# Patient Record
Sex: Female | Born: 1970 | Race: Black or African American | Hispanic: No | Smoking: Current every day smoker
Health system: Southern US, Community
[De-identification: ages and names within clinical notes are randomized; demographics above are authoritative.]

## PROBLEM LIST (undated history)

## (undated) DIAGNOSIS — N809 Endometriosis, unspecified: Secondary | ICD-10-CM

## (undated) DIAGNOSIS — I10 Essential (primary) hypertension: Secondary | ICD-10-CM

## (undated) HISTORY — PX: OTHER SURGICAL HISTORY: SHX169

## (undated) HISTORY — PX: BACK SURGERY: SHX140

---

## 2004-01-24 HISTORY — PX: BREAST EXCISIONAL BIOPSY: SUR124

## 2004-11-14 ENCOUNTER — Other Ambulatory Visit: Payer: Self-pay

## 2004-11-14 ENCOUNTER — Emergency Department: Payer: Self-pay | Admitting: Emergency Medicine

## 2005-02-27 ENCOUNTER — Emergency Department: Payer: Self-pay | Admitting: Emergency Medicine

## 2005-07-25 ENCOUNTER — Other Ambulatory Visit: Payer: Self-pay

## 2005-07-25 ENCOUNTER — Emergency Department: Payer: Self-pay | Admitting: Emergency Medicine

## 2008-03-02 ENCOUNTER — Ambulatory Visit: Payer: Self-pay | Admitting: Internal Medicine

## 2008-03-05 ENCOUNTER — Ambulatory Visit: Payer: Self-pay | Admitting: Internal Medicine

## 2008-05-17 ENCOUNTER — Emergency Department: Payer: Self-pay | Admitting: Emergency Medicine

## 2008-12-01 ENCOUNTER — Ambulatory Visit: Payer: Self-pay | Admitting: Unknown Physician Specialty

## 2009-09-04 ENCOUNTER — Emergency Department: Payer: Self-pay | Admitting: Emergency Medicine

## 2010-12-13 ENCOUNTER — Emergency Department: Payer: Self-pay | Admitting: *Deleted

## 2015-04-18 ENCOUNTER — Encounter: Payer: Self-pay | Admitting: Emergency Medicine

## 2015-04-18 ENCOUNTER — Emergency Department
Admission: EM | Admit: 2015-04-18 | Discharge: 2015-04-18 | Disposition: A | Discharge status: Home / Self Care | Payer: BLUE CROSS/BLUE SHIELD | Attending: Emergency Medicine | Admitting: Emergency Medicine

## 2015-04-18 DIAGNOSIS — F17210 Nicotine dependence, cigarettes, uncomplicated: Secondary | ICD-10-CM | POA: Insufficient documentation

## 2015-04-18 DIAGNOSIS — F419 Anxiety disorder, unspecified: Secondary | ICD-10-CM | POA: Diagnosis not present

## 2015-04-18 DIAGNOSIS — Z3202 Encounter for pregnancy test, result negative: Secondary | ICD-10-CM | POA: Diagnosis not present

## 2015-04-18 DIAGNOSIS — R110 Nausea: Secondary | ICD-10-CM

## 2015-04-18 DIAGNOSIS — R1013 Epigastric pain: Secondary | ICD-10-CM | POA: Insufficient documentation

## 2015-04-18 DIAGNOSIS — R002 Palpitations: Secondary | ICD-10-CM | POA: Diagnosis not present

## 2015-04-18 DIAGNOSIS — I10 Essential (primary) hypertension: Secondary | ICD-10-CM | POA: Insufficient documentation

## 2015-04-18 HISTORY — DX: Endometriosis, unspecified: N80.9

## 2015-04-18 HISTORY — DX: Essential (primary) hypertension: I10

## 2015-04-18 LAB — COMPREHENSIVE METABOLIC PANEL
ALK PHOS: 67 U/L (ref 38–126)
ALT: 28 U/L (ref 14–54)
AST: 37 U/L (ref 15–41)
Albumin: 4.1 g/dL (ref 3.5–5.0)
Anion gap: 8 (ref 5–15)
BILIRUBIN TOTAL: 0.6 mg/dL (ref 0.3–1.2)
BUN: 9 mg/dL (ref 6–20)
CO2: 20 mmol/L — ABNORMAL LOW (ref 22–32)
CREATININE: 1.13 mg/dL — AB (ref 0.44–1.00)
Calcium: 9.5 mg/dL (ref 8.9–10.3)
Chloride: 102 mmol/L (ref 101–111)
GFR calc Af Amer: >60 mL/min (ref >60)
GFR, EST NON AFRICAN AMERICAN: 58 mL/min — AB (ref >60)
GLUCOSE: 102 mg/dL — AB (ref 65–99)
Potassium: 3.1 mmol/L — ABNORMAL LOW (ref 3.5–5.1)
Sodium: 130 mmol/L — ABNORMAL LOW (ref 135–145)
TOTAL PROTEIN: 8.2 g/dL — AB (ref 6.5–8.1)

## 2015-04-18 LAB — TROPONIN I

## 2015-04-18 LAB — CBC
HEMATOCRIT: 36.5 % (ref 35.0–47.0)
Hemoglobin: 11.9 g/dL — ABNORMAL LOW (ref 12.0–16.0)
MCH: 29.1 pg (ref 26.0–34.0)
MCHC: 32.6 g/dL (ref 32.0–36.0)
MCV: 89.2 fL (ref 80.0–100.0)
PLATELETS: 304 10*3/uL (ref 150–440)
RBC: 4.09 MIL/uL (ref 3.80–5.20)
RDW: 13.9 % (ref 11.5–14.5)
WBC: 11.7 10*3/uL — AB (ref 3.6–11.0)

## 2015-04-18 LAB — URINALYSIS COMPLETE WITH MICROSCOPIC (ARMC ONLY)
BILIRUBIN URINE: NEGATIVE
GLUCOSE, UA: NEGATIVE mg/dL
KETONES UR: NEGATIVE mg/dL
LEUKOCYTES UA: NEGATIVE
NITRITE: NEGATIVE
PH: 6 (ref 5.0–8.0)
Protein, ur: NEGATIVE mg/dL
Specific Gravity, Urine: 1.008 (ref 1.005–1.030)

## 2015-04-18 LAB — POCT PREGNANCY, URINE: Preg Test, Ur: NEGATIVE

## 2015-04-18 LAB — LIPASE, BLOOD: LIPASE: 19 U/L (ref 11–51)

## 2015-04-18 MED ORDER — ONDANSETRON 4 MG PO TBDP: 4.0000 mg | ORAL_TABLET | Freq: Once | ORAL | Status: DC | PRN

## 2015-04-18 MED ORDER — SODIUM CHLORIDE 0.9 % IV BOLUS (SEPSIS)
1000.0000 mL | Freq: Once | INTRAVENOUS | Status: AC
  Administered 2015-04-18: 1000 mL via INTRAVENOUS

## 2015-04-18 MED ORDER — ONDANSETRON 4 MG PO TBDP: 4.0000 mg | ORAL_TABLET | Freq: Three times a day (TID) | ORAL | Status: AC | PRN

## 2015-04-18 MED ORDER — LORAZEPAM 2 MG/ML IJ SOLN
1.0000 mg | Freq: Once | INTRAMUSCULAR | Status: AC
  Administered 2015-04-18: 1 mg via INTRAVENOUS
  Filled 2015-04-18: qty 1

## 2015-04-18 MED ORDER — ONDANSETRON HCL 4 MG/2ML IJ SOLN
4.0000 mg | Freq: Once | INTRAMUSCULAR | Status: AC
  Administered 2015-04-18: 4 mg via INTRAVENOUS
  Filled 2015-04-18: qty 2

## 2015-04-18 MED ORDER — DIAZEPAM 5 MG PO TABS: 5.0000 mg | ORAL_TABLET | Freq: Three times a day (TID) | ORAL | Status: AC | PRN

## 2015-04-18 NOTE — ED Notes (Signed)
Pt ambulatory to restroom , with a steady gait

## 2015-04-18 NOTE — ED Provider Notes (Signed)
[facility] Emergency Department Provider Note  Time seen: 2:37 PM  I have reviewed the triage vital signs and the nursing notes.   HISTORY  Chief Complaint Anxiety and Palpitations    HPI Megan Pittman is a 45 y.o. female with a past medical history of hypertension presents the emergency department with anxiety, palpitations, agitation. According to the patient she works overnight's, Friday she did not sleep at all during the day and had to work overnight Friday night so she took approximately 8 energy tablets over the course of the evening. Denies any intent to hurt herself. Patient states she kept taking them because she continued to feel very tired like she was going to follow sleep. She states she was able to work her shift however upon going home she is not able to sleep, very fidgety, feeling like her heart is racing, develop nausea had several episodes of vomiting. She states she was able to sleep abruptly one or 2 hours yesterday, but continues to feel anxious with palpitations today so she came to the emergency department. Denies any chest pain she does state epigastric pain but only when she feels nauseated like she has to vomit. Denies diarrhea. Denies dysuria. Denies fever.     Past Medical History  Diagnosis Date  . Hypertension   . Endometriosis     There are no active problems to display for this patient.   Past Surgical History  Procedure Laterality Date  . Back surgery    . Mass removed      No current outpatient prescriptions on file.  Allergies Review of patient's allergies indicates no known allergies.  No family history on file.  Social History Social History  Substance Use Topics  . Smoking status: Current Every Day Smoker -- 0.50 packs/day    Types: Cigarettes  . Smokeless tobacco: None  . Alcohol Use: Yes     Comment: socially    Review of Systems Constitutional: Negative for fever. Cardiovascular: Negative for  chest pain. Positive for palpitations. Respiratory: Negative for shortness of breath. Gastrointestinal: Epigastric pain positive for nausea and vomiting. Negative diarrhea. Genitourinary: Negative for dysuria. Neurological: Negative for headache 10-point ROS otherwise negative.  ____________________________________________   PHYSICAL EXAM:  VITAL SIGNS: ED Triage Vitals  Enc Vitals Group     BP 04/18/15 1354 164/89 mmHg     Pulse Rate 04/18/15 1354 93     Resp 04/18/15 1354 20     Temp 04/18/15 1354 98.2 F (36.8 C)     Temp Source 04/18/15 1354 Oral     SpO2 04/18/15 1354 100 %     Weight 04/18/15 1354 204 lb (92.534 kg)     Height 04/18/15 1354 5' 10.5" (1.791 m)     Head Cir --      Peak Flow --      Pain Score 04/18/15 1355 9     Pain Loc --      Pain Edu? --      Excl. in GC? --     Constitutional: Alert and oriented. Mild distress due to anxiety, fidgety during exam Eyes: Normal exam ENT   Head: Normocephalic and atraumatic   Mouth/Throat: Mucous membranes are moist. Cardiovascular: Normal rate, regular rhythm. No murmur Respiratory: Normal respiratory effort without tachypnea nor retractions. Breath sounds are clear Gastrointestinal: Soft, mild epigastric tenderness to palpation without rebound or guarding. No distention. Musculoskeletal: Nontender with normal range of motion in all extremities.  Neurologic:  Normal speech and  language. No gross focal neurologic deficits Skin:  Skin is warm, dry and intact.  Psychiatric: Anxious, voices tremulous when speaking.  ____________________________________________    EKG  EKG reviewed and interpreted by myself shows normal sinus rhythm at 88 bpm, narrow QRS, normal axis, normal intervals, nonspecific but no concerning ST changes.   INITIAL IMPRESSION / ASSESSMENT AND PLAN / ED COURSE  Pertinent labs & imaging results that were available during my care of the patient were reviewed by me and considered in my  medical decision making (see chart for details).  Patient presents the emergency department with anxiety and palpitations after taking a significant amount of energy tablets approximately 36 hours ago. We will dose nausea medication, IV hydrate, does Ativan, while awaiting lab results. Overall the patient appears well, nontoxic but she does appear quite anxious.  Labs are largely within normal limits besides a lower sodium level. Patient states she has been trying to drink a lot of water to help with her symptoms. I discussed with the patient drinking Gatorade or other fluids with electrolytes. Overall the patient appears much better but still states she feels very anxious and jittery. We will discharge with a short course of Valium. I emphasized that the patient needs to take this medication as prescribed as to much of this medication can cause harm including respiratory arrest. Patient is agreeable to plan. We'll discharge home with primary care follow-up.  ____________________________________________   FINAL CLINICAL IMPRESSION(S) / ED DIAGNOSES  Palpitations Anxiety  Minna Antis, MD 04/18/15 [id number]

## 2015-04-18 NOTE — ED Notes (Signed)
Pt EKG performed by Loleta DickerAnna Holt (RN)

## 2015-04-18 NOTE — ED Notes (Signed)
Patient presents to the ED with nausea, anxiety, and agitations since Saturday morning with feeling like her heart is beating fast.  Patient reports taking 8 energy pills and drinking 2 energy drinks yesterday.  Patient is very fidgety during triage, cannot sit still.  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  Patient states she has not slept much since she took the medication.  Patient reports sleeping two hours yesterday.  Patient denies having any caffeine since that time.

## 2015-04-18 NOTE — Discharge Instructions (Signed)
Nausea, Adult Nausea means you feel sick to your stomach or need to throw up (vomit). It may be a sign of a more serious problem. If nausea gets worse, you may throw up. If you throw up a lot, you may lose too much body fluid (dehydration). HOME CARE   Get plenty of rest.  Ask your doctor how to replace body fluid losses (rehydrate).  Eat small amounts of food. Sip liquids more often.  Take all medicines as told by your doctor. GET HELP RIGHT AWAY IF:  You have a fever.  You pass out (faint).  You keep throwing up or have blood in your throw up.  You are very weak, have dry lips or a dry mouth, or you are very thirsty (dehydrated).  You have dark or bloody poop (stool).  You have very bad chest or belly (abdominal) pain.  You do not get better after 2 days, or you get worse.  You have a headache. MAKE SURE YOU:  Understand these instructions.  Will watch your condition.  Will get help right away if you are not doing well or get worse.   This information is not intended to replace advice given to you by your health care provider. Make sure you discuss any questions you have with your health care provider.   Document Released: 12/29/2010 Document Revised: 04/03/2011 Document Reviewed: 12/29/2010 Elsevier Interactive Patient Education 2016 [employer]  Palpitations A palpitation is the feeling that your heartbeat is irregular. It may feel like your heart is fluttering or skipping a beat. It may also feel like your heart is beating faster than normal. This is usually not a serious problem. In some cases, you may need more medical tests. HOME CARE  Avoid:  Caffeine in coffee, tea, soft drinks, diet pills, and energy drinks.  Chocolate.  Alcohol.  Stop smoking if you smoke.  Reduce your stress and anxiety. Try:  A method that measures bodily functions so you can learn to control them (biofeedback).  Yoga.  Meditation.  Physical activity such as swimming,  jogging, or walking.  Get plenty of rest and sleep. GET HELP IF:  Your fast or irregular heartbeat continues after 24 hours.  Your palpitations occur more often. GET HELP RIGHT AWAY IF:   You have chest pain.  You feel short of breath.  You have a very bad headache.  You feel dizzy or pass out (faint). MAKE SURE YOU:   Understand these instructions.  Will watch your condition.  Will get help right away if you are not doing well or get worse.   This information is not intended to replace advice given to you by your health care provider. Make sure you discuss any questions you have with your health care provider.   Document Released: 10/19/2007 Document Revised: 01/30/2014 Document Reviewed: 03/10/2011 Elsevier Interactive Patient Education [employer] Elsevier Inc.

## 2016-08-23 ENCOUNTER — Emergency Department
Admission: EM | Admit: 2016-08-23 | Discharge: 2016-08-23 | Disposition: A | Discharge status: Home / Self Care | Payer: BLUE CROSS/BLUE SHIELD | Attending: Emergency Medicine | Admitting: Emergency Medicine

## 2016-08-23 ENCOUNTER — Emergency Department: Payer: BLUE CROSS/BLUE SHIELD

## 2016-08-23 ENCOUNTER — Encounter: Payer: Self-pay | Admitting: Emergency Medicine

## 2016-08-23 DIAGNOSIS — I10 Essential (primary) hypertension: Secondary | ICD-10-CM | POA: Diagnosis not present

## 2016-08-23 DIAGNOSIS — F17210 Nicotine dependence, cigarettes, uncomplicated: Secondary | ICD-10-CM | POA: Diagnosis not present

## 2016-08-23 DIAGNOSIS — Z79899 Other long term (current) drug therapy: Secondary | ICD-10-CM | POA: Insufficient documentation

## 2016-08-23 DIAGNOSIS — M5442 Lumbago with sciatica, left side: Secondary | ICD-10-CM | POA: Diagnosis not present

## 2016-08-23 DIAGNOSIS — M545 Low back pain: Secondary | ICD-10-CM | POA: Diagnosis present

## 2016-08-23 MED ORDER — CYCLOBENZAPRINE HCL 5 MG PO TABS: 5.0000 mg | ORAL_TABLET | Freq: Three times a day (TID) | ORAL | 0 refills | Status: AC | PRN

## 2016-08-23 MED ORDER — LIDOCAINE 5 % EX PTCH
1.0000 | MEDICATED_PATCH | CUTANEOUS | Status: DC
  Administered 2016-08-23: 1 via TRANSDERMAL
  Filled 2016-08-23: qty 1

## 2016-08-23 MED ORDER — PREDNISONE 10 MG (21) PO TBPK: ORAL_TABLET | ORAL | 0 refills | Status: AC

## 2016-08-23 MED ORDER — LIDOCAINE 5 % EX PTCH: 1.0000 | MEDICATED_PATCH | Freq: Two times a day (BID) | CUTANEOUS | 0 refills | Status: AC

## 2016-08-23 MED ORDER — METHYLPREDNISOLONE SODIUM SUCC 125 MG IJ SOLR
125.0000 mg | Freq: Once | INTRAMUSCULAR | Status: AC
  Administered 2016-08-23: 125 mg via INTRAMUSCULAR
  Filled 2016-08-23: qty 2

## 2016-08-23 MED ORDER — KETOROLAC TROMETHAMINE 60 MG/2ML IM SOLN
30.0000 mg | Freq: Once | INTRAMUSCULAR | Status: AC
  Administered 2016-08-23: 30 mg via INTRAMUSCULAR
  Filled 2016-08-23: qty 2

## 2016-08-23 MED ORDER — ORPHENADRINE CITRATE 30 MG/ML IJ SOLN
60.0000 mg | Freq: Two times a day (BID) | INTRAMUSCULAR | Status: DC
  Administered 2016-08-23: 60 mg via INTRAMUSCULAR
  Filled 2016-08-23: qty 2

## 2016-08-23 NOTE — ED Notes (Signed)
Pt had negative preg 2 weeks ago  And is currently bleeding at present

## 2016-08-23 NOTE — ED Provider Notes (Signed)
[facility] Emergency Department Provider Note  ____________________________________________  Time seen: Approximately 9:25 AM  I have reviewed the triage vital signs and the nursing notes.   HISTORY  Chief Complaint Back Pain    HPI Megan Pittman is a 46 y.o. female that presents to emergency department with low back pain for 4 days. Patient lifted a laundry basket 4 days ago and heard a pop in her lower back. Pain has progressively gotten worse. It is primarily over her left side and on the outside of her left hip. Yesterday she had difficulty walking due to pain. She has a history of chronic back pain and has a bulging disc. She has had sciatica on the right side. She sees a primary care doctor in [place] for her back pain and takes oxycodone daily. She took a Percocet 6 hours ago, which did not help. She has been alternating ice and heat for pain.She heard from a coworker that prednisone might help. She denies bowel or bladder dysfunction or saddle paresthesias. No shortness of breath, chest pain, nausea, vomiting, abdominal pain.   Past Medical History:  Diagnosis Date  . Endometriosis   . Hypertension     There are no active problems to display for this patient.   Past Surgical History:  Procedure Laterality Date  . BACK SURGERY    . mass removed      Prior to Admission medications   Medication Sig Start Date End Date Taking? Authorizing Provider  baclofen (LIORESAL) 10 MG tablet Take 10 mg by mouth 3 (three) times daily.   Yes [provider]  chlorthalidone (HYGROTON) 25 MG tablet Take 25 mg by mouth daily.   Yes [provider]  lisinopril (PRINIVIL,ZESTRIL) 40 MG tablet Take 40 mg by mouth daily.   Yes [provider]  oxyCODONE-acetaminophen (PERCOCET) 10-325 MG tablet Take 1 tablet by mouth every 4 (four) hours as needed for pain.   Yes [provider]  cyclobenzaprine (FLEXERIL) 5 MG tablet Take 1  tablet (5 mg total) by mouth 3 (three) times daily as needed for muscle spasms. 08/23/16 08/30/16  Enid DerryWagner, Davetta Olliff, PA-C  lidocaine (LIDODERM) 5 % Place 1 patch onto the skin every 12 (twelve) hours. Remove & Discard patch within 12 hours or as directed by MD 08/23/16 08/23/17  Enid DerryWagner, Graysin Luczynski, PA-C  ondansetron (ZOFRAN ODT) 4 MG disintegrating tablet Take 1 tablet (4 mg total) by mouth every 8 (eight) hours as needed for nausea or vomiting. 04/18/15   Minna AntisPaduchowski, Kevin, MD  predniSONE (STERAPRED UNI-PAK 21 TAB) 10 MG (21) TBPK tablet Take 6 tablets on day 1, take 5 tablets on day 2, take 4 tablets on day 3, take 3 tablets on day 4, take 2 tablets on day 5, take 1 tablet on day 6 08/23/16   Enid DerryWagner, Moe Brier, PA-C    Allergies Patient has no known allergies.  History reviewed. No pertinent family history.  Social History Social History  Substance Use Topics  . Smoking status: Current Every Day Smoker    Packs/day: 0.50    Types: Cigarettes  . Smokeless tobacco: Never Used  . Alcohol use Yes     Comment: socially     Review of Systems  Constitutional: No fever/chills Cardiovascular: No chest pain. Respiratory: No SOB. Gastrointestinal: No abdominal pain.  No nausea, no vomiting.  Musculoskeletal: Positive for back pain. Skin: Negative for rash, abrasions, lacerations, ecchymosis. Neurological: Negative for headaches, numbness or tingling   ____________________________________________   PHYSICAL  EXAM:  VITAL SIGNS: ED Triage Vitals [08/23/16 0847]  Enc Vitals Group     BP 139/75     Pulse Rate 90     Resp 18     Temp 98.7 F (37.1 C)     Temp Source Oral     SpO2 100 %     Weight 204 lb (92.5 kg)     Height      Head Circumference      Peak Flow      Pain Score 10     Pain Loc      Pain Edu?      Excl. in GC?      Constitutional: Alert and oriented. Well appearing and in no acute distress. Eyes: Conjunctivae are normal. PERRL. EOMI. Head: Atraumatic. ENT:      Ears:       Nose: No congestion/rhinnorhea.      Mouth/Throat: Mucous membranes are moist.  Neck: No stridor.   Cardiovascular: Normal rate, regular rhythm.  Good peripheral circulation. Respiratory: Normal respiratory effort without tachypnea or retractions. Lungs CTAB. Good air entry to the bases with no decreased or absent breath sounds. Gastrointestinal: Bowel sounds 4 quadrants. Soft and nontender to palpation. No guarding or rigidity. No palpable masses. No distention. No CVA tenderness. Musculoskeletal: Full range of motion to all extremities. No gross deformities appreciated. Tenderness to palpation over left SI joint. Positive straight leg and cross leg raise. Neurologic:  Normal speech and language. No gross focal neurologic deficits are appreciated.  Skin:  Skin is warm, dry and intact. No rash noted.  ____________________________________________   LABS (all labs ordered are listed, but only abnormal results are displayed)  Labs Reviewed - No data to display ____________________________________________  EKG   ____________________________________________  RADIOLOGY Lexine BatonI, Dannell Raczkowski, personally viewed and evaluated these images (plain radiographs) as part of my medical decision making, as well as reviewing the written report by the radiologist.  Dg Lumbar Spine Complete  Result Date: 08/23/2016 CLINICAL DATA:  Lumbago with left-sided radicular symptoms for 4 days EXAM: LUMBAR SPINE - COMPLETE 4+ VIEW COMPARISON:  Lumbar MRI March 05, 2008 FINDINGS: Frontal, lateral, spot lumbosacral lateral, and bilateral oblique views were obtained. There are 5 non-rib-bearing lumbar type vertebral bodies. There is lower lumbar dextroscoliosis with slight rotatory component. There is no fracture or spondylolisthesis. Disc spaces appear normal. There is facet osteoarthritic change at L5-S1 bilaterally. 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 No fracture or spondylolisthesis. There is mild  scoliosis. Electronically Signed   By: Bretta BangWilliam  Woodruff III M.D.   On: 08/23/2016 10:18    ____________________________________________    PROCEDURES  Procedure(s) performed:    Procedures    Medications  ketorolac (TORADOL) injection 30 mg (30 mg Intramuscular Given 08/23/16 1053)  methylPREDNISolone sodium succinate (SOLU-MEDROL) 125 mg/2 mL injection 125 mg (125 mg Intramuscular Given 08/23/16 1053)    ____________________________________________   INITIAL IMPRESSION / ASSESSMENT AND PLAN / ED COURSE  Pertinent labs & imaging results that were available during my care of the patient were reviewed by me and considered in my medical decision making (see chart for details).  Review of the [place] CSRS was performed in accordance of the [facility] prior to dispensing any controlled drugs.  Patient presented to emergency department for evaluation of back pain for 4 days. Patient's diagnosis is consistent with sciatica. No acute bony abnormalities on lumbar x-ray. X-ray indicates arthritis and scoliosis and findings were discussed with patient. Symptoms improved with Toradol, Norflex, Solu-Medrol.  No bowel or bladder dysfunction or saddle paresthesias. Patient will be discharged home with prescriptions for Flexeril and prednisone. Patient is to follow up with PCP as directed. Patient is given ED precautions to return to the ED for any worsening or new symptoms.     ____________________________________________  FINAL CLINICAL IMPRESSION(S) / ED DIAGNOSES  Final diagnoses:  Left-sided low back pain with left-sided sciatica, unspecified chronicity      NEW MEDICATIONS STARTED DURING THIS VISIT:  Discharge Medication List as of 08/23/2016 11:41 AM    START taking these medications   Details  cyclobenzaprine (FLEXERIL) 5 MG tablet Take 1 tablet (5 mg total) by mouth 3 (three) times daily as needed for muscle spasms., Starting Wed 08/23/2016, Until Wed 08/30/2016, Print    lidocaine  (LIDODERM) 5 % Place 1 patch onto the skin every 12 (twelve) hours. Remove & Discard patch within 12 hours or as directed by MD, Starting Wed 08/23/2016, Until Thu 08/23/2017, Print    predniSONE (STERAPRED UNI-PAK 21 TAB) 10 MG (21) TBPK tablet Take 6 tablets on day 1, take 5 tablets on day 2, take 4 tablets on day 3, take 3 tablets on day 4, take 2 tablets on day 5, take 1 tablet on day 6, Print            This chart was dictated using voice recognition software/Dragon. Despite best efforts to proofread, errors can occur which can change the meaning. Any change was purely unintentional.    Enid Derry, PA-C 08/23/16 1554    Enid Derry, PA-C 08/23/16 1555    Merrily Brittle, MD 08/23/16 [id number]

## 2016-08-23 NOTE — ED Notes (Signed)
FN: pt presents to front desk with reports of low back pain.

## 2016-08-23 NOTE — ED Triage Notes (Signed)
Pt to ed with c/o back pain after lifting a laundry basket on Saturday.  Pt reports pain has progressively gotten worse despite use of pain meds at home and rest.

## 2016-08-23 NOTE — ED Notes (Addendum)
See triage note  States she developed lower back pain on Sunday.. States pain started after lifting a laundry basket  Pain has been getting worse since. States pain is moving into left leg  Unable to bear wt d/t increased pain

## 2018-06-05 ENCOUNTER — Encounter: Payer: Self-pay | Admitting: Family

## 2018-06-05 ENCOUNTER — Other Ambulatory Visit: Payer: Self-pay

## 2018-06-05 ENCOUNTER — Ambulatory Visit (INDEPENDENT_AMBULATORY_CARE_PROVIDER_SITE_OTHER): Payer: Self-pay | Admitting: Orthopedic Surgery

## 2018-06-05 VITALS — Ht 70.5 in | Wt 204.0 lb

## 2018-06-05 DIAGNOSIS — M1A0720 Idiopathic chronic gout, left ankle and foot, without tophus (tophi): Secondary | ICD-10-CM

## 2018-06-05 DIAGNOSIS — M79672 Pain in left foot: Secondary | ICD-10-CM

## 2018-06-05 MED ORDER — ALLOPURINOL 100 MG PO TABS: 100.0000 mg | ORAL_TABLET | Freq: Two times a day (BID) | ORAL | 3 refills | Status: AC

## 2018-06-05 MED ORDER — COLCHICINE 0.6 MG PO CAPS: 0.6000 mg | ORAL_CAPSULE | Freq: Two times a day (BID) | ORAL | 3 refills | Status: AC | PRN

## 2018-06-05 NOTE — Progress Notes (Signed)
Office Visit Note   Patient: Megan Pittman           Date of Pittman: [birth year]           MRN: [id number] Visit Date: 06/05/2018              Requested by: Megan Pittman [address] [id number] [facility] [address], [place] [id number] PCP: Megan Pittman  Chief Complaint  Patient presents with  . Left Foot - Pain      HPI: Patient is a 48 year old woman who is seen for initial evaluation in referral by Megan Pittman.  Patient states that she does work standing on her feet for prolonged periods of time she states she went to [employer] and had acute onset of pain through the midfoot.  She describes as a sharp throbbing pain that involves the midfoot as well as the great toe MTP joint.  She states that her foot was numb and cool to the touch she complains of pain with light touch to the foot with a knot over the base of the first and second metatarsal.  Patient states she is tried ibuprofen and Aleve without relief.  Denies any trauma.  Assessment & Plan: Visit Diagnoses:  1. 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  She will have a uric acid level drawn she requested this be done at her office she states she is difficult stick.  Prescription called into [facility] on [address] in [place] for colchicine and allopurinol.  She will take the colchicine twice a day until symptomatic and then add in the allopurinol.  She will wean off the colchicine as her symptoms resolved.  Follow-Up Instructions: Return in about 1 week (around 06/12/2018).   Ortho Exam  Patient is alert, oriented, no adenopathy, well-dressed, normal affect, normal respiratory effort. Examination patient has a good pulse there is no redness no cellulitis no signs of abscess or infection.  The accessory navicular is nontender to palpation.  She does have swelling at the base of the first and second metatarsal and this  is exquisitely tender to light touch.  She also has pain to palpation of the great toe MTP joint and pain with attempted range of motion of the great toe MTP joint.  Patient's radiographs from her office were reviewed on my phone it does show an accessory navicular does not show any destructive bony changes in the midfoot no Lisfranc instability.  There is no cellulitis.  Imaging: No results found. No images are attached to the encounter.  Labs: No results found for: HGBA1C, ESRSEDRATE, CRP, LABURIC, REPTSTATUS, GRAMSTAIN, CULT, LABORGA   Lab Results  Component Value Date   ALBUMIN 4.1 04/18/2015    Body mass index is 28.86 kg/m.  Orders:  Orders Placed This Encounter  Procedures  . Uric acid   Meds ordered this encounter  Medications  . Colchicine 0.6 MG CAPS    Sig: Take 0.6 mg by mouth 2 (two) times daily as needed.    Dispense:  60 capsule    Refill:  3  . allopurinol (ZYLOPRIM) 100 MG tablet    Sig: Take 1 tablet (100 mg total) by mouth 2 (two) times daily.    Dispense:  60 tablet    Refill:  3     Procedures: No procedures performed  Clinical Data: No additional findings.  ROS:  All other systems negative, except as noted in the HPI. Review of Systems  Objective: Vital Signs: Ht 5' 10.5" (1.791 m)   Wt 204 lb (92.5 kg)   BMI 28.86 kg/m   Specialty Comments:  No specialty comments available.  PMFS History: There are no active problems to display for this patient.  Past Medical History:  Diagnosis Date  . Endometriosis   . Hypertension     History reviewed. No pertinent family history.  Past Surgical History:  Procedure Laterality Date  . BACK SURGERY    . mass removed     Social History   Occupational History  . Not on file  Tobacco Use  . Smoking status: Current Every Day Smoker    Packs/day: 0.50    Types: Cigarettes  . Smokeless tobacco: Never Used  Substance and Sexual Activity  . Alcohol use: Yes    Comment: socially  . Drug  use: Not on file  . Sexual activity: Not on file

## 2018-06-06 ENCOUNTER — Other Ambulatory Visit: Payer: Self-pay | Admitting: Orthopedic Surgery

## 2018-06-06 ENCOUNTER — Telehealth: Payer: Self-pay

## 2018-06-06 MED ORDER — COLCHICINE 0.6 MG PO CAPS: 0.6000 mg | ORAL_CAPSULE | Freq: Two times a day (BID) | ORAL | 3 refills | Status: AC | PRN

## 2018-06-06 NOTE — Telephone Encounter (Signed)
rx sent

## 2018-06-06 NOTE — Telephone Encounter (Signed)
Received prior auth for this pt's colchicine. The formulary medicine for this insurance plan is Mitigare. She must try and fail this medication first before they will approve. Please send rx for this to pt pharm.

## 2020-09-29 ENCOUNTER — Encounter: Payer: Self-pay | Admitting: Emergency Medicine

## 2020-09-29 ENCOUNTER — Emergency Department: Payer: BC Managed Care – PPO

## 2020-09-29 ENCOUNTER — Emergency Department
Admission: EM | Admit: 2020-09-29 | Discharge: 2020-09-29 | Disposition: A | Discharge status: Home / Self Care | Payer: BC Managed Care – PPO | Attending: Emergency Medicine | Admitting: Emergency Medicine

## 2020-09-29 ENCOUNTER — Other Ambulatory Visit: Payer: Self-pay

## 2020-09-29 DIAGNOSIS — F419 Anxiety disorder, unspecified: Secondary | ICD-10-CM | POA: Insufficient documentation

## 2020-09-29 DIAGNOSIS — Z79899 Other long term (current) drug therapy: Secondary | ICD-10-CM | POA: Insufficient documentation

## 2020-09-29 DIAGNOSIS — I10 Essential (primary) hypertension: Secondary | ICD-10-CM | POA: Diagnosis not present

## 2020-09-29 DIAGNOSIS — F17210 Nicotine dependence, cigarettes, uncomplicated: Secondary | ICD-10-CM | POA: Insufficient documentation

## 2020-09-29 DIAGNOSIS — R0602 Shortness of breath: Secondary | ICD-10-CM

## 2020-09-29 DIAGNOSIS — R0789 Other chest pain: Secondary | ICD-10-CM

## 2020-09-29 LAB — CBC
HCT: 41.9 % (ref 36.0–46.0)
Hemoglobin: 13.8 g/dL (ref 12.0–15.0)
MCH: 28.6 pg (ref 26.0–34.0)
MCHC: 32.9 g/dL (ref 30.0–36.0)
MCV: 86.9 fL (ref 80.0–100.0)
Platelets: 412 10*3/uL — ABNORMAL HIGH (ref 150–400)
RBC: 4.82 MIL/uL (ref 3.87–5.11)
RDW: 13.9 % (ref 11.5–15.5)
WBC: 7 10*3/uL (ref 4.0–10.5)
nRBC: 0 % (ref 0.0–0.2)

## 2020-09-29 LAB — BASIC METABOLIC PANEL
Anion gap: 10 (ref 5–15)
BUN: 16 mg/dL (ref 6–20)
CO2: 27 mmol/L (ref 22–32)
Calcium: 9.6 mg/dL (ref 8.9–10.3)
Chloride: 100 mmol/L (ref 98–111)
Creatinine, Ser: 1.03 mg/dL — ABNORMAL HIGH (ref 0.44–1.00)
GFR, Estimated: >60 mL/min (ref >60)
Glucose, Bld: 97 mg/dL (ref 70–99)
Potassium: 3 mmol/L — ABNORMAL LOW (ref 3.5–5.1)
Sodium: 137 mmol/L (ref 135–145)

## 2020-09-29 LAB — TROPONIN I (HIGH SENSITIVITY): Troponin I (High Sensitivity): 6 ng/L (ref <18)

## 2020-09-29 NOTE — ED Triage Notes (Signed)
First Nurse Note:  Arrives from [facility] for ED evaluation of intermittent chest pain.  AAOx3.  Skin warm and dry. NAD

## 2020-09-29 NOTE — ED Provider Notes (Signed)
[facility] Emergency Department Provider Note   ____________________________________________   Event Date/Time   First MD Initiated Contact with Patient 09/29/20 1117     (approximate)  I have reviewed the triage vital signs and the nursing notes.   HISTORY  Chief Complaint No chief complaint on file.    HPI Megan Pittman is a 50 y.o. female who presents for intermittent left-sided chest pain  LOCATION: Left chest DURATION: 1 month prior to arrival TIMING: Intermittent SEVERITY: Currently 0/10 but 7/10 at the worst QUALITY: Sharp left chest pain CONTEXT: Patient states that approxi-1 month ago she was driving on the highway and felt sharp left-sided chest pain with associated shortness of breath that she states is worsened with her worsened anxiety MODIFYING FACTORS: States these symptoms are worsened with anxiety and partially relieved and states of calm ASSOCIATED SYMPTOMS: Shortness of breath, palpitations   Per medical record review, patient has history of hypertension and endometriosis          Past Medical History:  Diagnosis Date   Endometriosis    Hypertension     There are no problems to display for this patient.   Past Surgical History:  Procedure Laterality Date   BACK SURGERY     mass removed      Prior to Admission medications   Medication Sig Start Date End Date Taking? Authorizing Provider  allopurinol (ZYLOPRIM) 100 MG tablet Take 1 tablet (100 mg total) by mouth 2 (two) times daily. 06/05/18   Nadara Mustard, MD  baclofen (LIORESAL) 10 MG tablet Take 10 mg by mouth 3 (three) times daily.    [provider]  chlorthalidone (HYGROTON) 25 MG tablet Take 25 mg by mouth daily.    [provider]  Colchicine (MITIGARE) 0.6 MG CAPS Take 0.6 mg by mouth 2 (two) times daily as needed. 06/06/18   Nadara Mustard, MD  Colchicine 0.6 MG CAPS Take 0.6 mg by mouth 2 (two) times daily as needed. 06/05/18   Nadara Mustard, MD  lisinopril (PRINIVIL,ZESTRIL) 40 MG tablet Take 40 mg by mouth daily.    [provider]  ondansetron (ZOFRAN ODT) 4 MG disintegrating tablet Take 1 tablet (4 mg total) by mouth every 8 (eight) hours as needed for nausea or vomiting. 04/18/15   Minna Antis, MD  oxyCODONE-acetaminophen (PERCOCET) 10-325 MG tablet Take 1 tablet by mouth every 4 (four) hours as needed for pain.    [provider]  predniSONE (STERAPRED UNI-PAK 21 TAB) 10 MG (21) TBPK tablet Take 6 tablets on day 1, take 5 tablets on day 2, take 4 tablets on day 3, take 3 tablets on day 4, take 2 tablets on day 5, take 1 tablet on day 6 08/23/16   Enid Derry, PA-C    Allergies Patient has no known allergies.  No family history on file.  Social History Social History   Tobacco Use   Smoking status: Every Day    Packs/day: 0.50    Types: Cigarettes   Smokeless tobacco: Never  Substance Use Topics   Alcohol use: Yes    Comment: socially    Review of Systems Constitutional: No fever/chills Eyes: No visual changes. ENT: No sore throat. Cardiovascular: Endorses chest pain. Respiratory: Endorses shortness of breath. Gastrointestinal: No abdominal pain.  No nausea, no vomiting.  No diarrhea. Genitourinary: Negative for dysuria. Musculoskeletal: Negative for acute arthralgias Skin: Negative for rash. Neurological: Negative for headaches, weakness/numbness/paresthesias in any extremity Psychiatric: Endorses  anxiety.  Negative for suicidal ideation/homicidal ideation   ____________________________________________   PHYSICAL EXAM:  VITAL SIGNS: ED Triage Vitals  Enc Vitals Group     BP 09/29/20 1055 (!) 149/110     Pulse Rate 09/29/20 1055 84     Resp 09/29/20 1055 16     Temp 09/29/20 1055 98.7 F (37.1 C)     Temp Source 09/29/20 1055 Oral     SpO2 09/29/20 1055 99 %     Weight 09/29/20 1043 203 lb 14.8 oz (92.5 kg)     Height 09/29/20 1043 5' 10.5" (1.791 m)     Head  Circumference --      Peak Flow --      Pain Score 09/29/20 1042 0     Pain Loc --      Pain Edu? --      Excl. in GC? --    Constitutional: Alert and oriented. Well appearing and in no acute distress. Eyes: Conjunctivae are normal. PERRL. Head: Atraumatic. Nose: No congestion/rhinnorhea. Mouth/Throat: Mucous membranes are moist. Neck: No stridor Cardiovascular: Grossly normal heart sounds.  Good peripheral circulation. Respiratory: Normal respiratory effort.  No retractions. Gastrointestinal: Soft and nontender. No distention. Musculoskeletal: No obvious deformities Neurologic:  Normal speech and language. No gross focal neurologic deficits are appreciated. Skin:  Skin is warm and dry. No rash noted. Psychiatric: Mood and affect are normal. Speech and behavior are normal.  ____________________________________________   LABS (all labs ordered are listed, but only abnormal results are displayed)  Labs Reviewed  BASIC METABOLIC PANEL - Abnormal; Notable for the following components:      Result Value   Potassium 3.0 (*)    Creatinine, Ser 1.03 (*)    All other components within normal limits  CBC - Abnormal; Notable for the following components:   Platelets 412 (*)    All other components within normal limits  POC URINE PREG, ED  TROPONIN I (HIGH SENSITIVITY)   ____________________________________________  EKG  ED ECG REPORT I, Merwyn Katos, the attending physician, personally viewed and interpreted this ECG.  Date: 09/29/2020 EKG Time: 1052 Rate: 86 Rhythm: normal sinus rhythm QRS Axis: normal Intervals: LPFB, normal ST/T Wave abnormalities: normal Narrative Interpretation: LPFB, no evidence of acute ischemia  ____________________________________________  RADIOLOGY  ED MD interpretation: 2 view chest x-ray shows no evidence of acute abnormalities including no pneumonia, pneumothorax, or widened mediastinum  Official radiology report(s): DG Chest 2  View  Result Date: 09/29/2020 CLINICAL DATA:  Chest pain. EXAM: CHEST - 2 VIEW COMPARISON:  07/25/2005 FINDINGS: The cardiomediastinal silhouette is within normal limits. The lungs are well inflated and clear. There is no evidence of pleural effusion or pneumothorax. No acute osseous abnormality is identified. IMPRESSION: No active cardiopulmonary disease. Electronically Signed   By: Sebastian Ache M.D.   On: 09/29/2020 11:40    ____________________________________________   PROCEDURES  Procedure(s) performed (including Critical Care):  .1-3 Lead EKG Interpretation  Date/Time: 09/29/2020 12:18 PM Performed by: Merwyn Katos, MD Authorized by: Merwyn Katos, MD     Interpretation: normal     ECG rate:  83   ECG rate assessment: normal     Rhythm: sinus rhythm     Ectopy: none     Conduction: normal     ____________________________________________   INITIAL IMPRESSION / ASSESSMENT AND PLAN / ED COURSE  As part of my medical decision making, I reviewed the following data within the electronic medical record, if available:  Nursing  notes reviewed and incorporated, Labs reviewed, EKG interpreted, Old chart reviewed, Radiograph reviewed and Notes from prior ED visits reviewed and incorporated      Workup: ECG, CXR, CBC, BMP, Troponin Findings: ECG: No overt evidence of STEMI. No evidence of Brugadas sign, delta wave, epsilon wave, significantly prolonged QTc, or malignant arrhythmia HS Troponin: Negative x1 Other Labs unremarkable for emergent problems. CXR: Without PTX, PNA, or widened mediastinum Last Stress Test: Never Last Heart Catheterization: Never HEART Score: 2  Given History, Exam, and Workup I have low suspicion for ACS, Pneumothorax, Pneumonia, Pulmonary Embolus, Tamponade, Aortic Dissection or other emergent problem as a cause for this presentation.  There is likely a component of anxiety  Reassesment: Prior to discharge patients pain was controlled and they were  well appearing.  Disposition:  Discharge. Strict return precautions discussed with patient with full understanding. Advised patient to follow up promptly with primary care provider      ____________________________________________   FINAL CLINICAL IMPRESSION(S) / ED DIAGNOSES  Final diagnoses:  Intermittent left-sided chest pain  Shortness of breath  Anxiety     ED Discharge Orders     None        Note:  This document was prepared using Dragon voice recognition software and may include unintentional dictation errors.    Merwyn Katos, MD 09/29/20 [phone]

## 2020-09-29 NOTE — ED Notes (Signed)
Patient wanting to go outside and be sure car door is locked prior to EKG

## 2020-11-29 ENCOUNTER — Other Ambulatory Visit: Payer: Self-pay | Admitting: Student

## 2020-11-29 DIAGNOSIS — Z1231 Encounter for screening mammogram for malignant neoplasm of breast: Secondary | ICD-10-CM

## 2020-12-03 ENCOUNTER — Ambulatory Visit
Admission: RE | Admit: 2020-12-03 | Discharge: 2020-12-03 | Disposition: A | Discharge status: Home / Self Care | Payer: BC Managed Care – PPO | Source: Ambulatory Visit | Attending: Student | Admitting: Student

## 2020-12-03 ENCOUNTER — Other Ambulatory Visit: Payer: Self-pay

## 2020-12-03 DIAGNOSIS — Z1231 Encounter for screening mammogram for malignant neoplasm of breast: Secondary | ICD-10-CM

## 2020-12-09 ENCOUNTER — Other Ambulatory Visit: Payer: Self-pay | Admitting: Student

## 2020-12-09 DIAGNOSIS — R928 Other abnormal and inconclusive findings on diagnostic imaging of breast: Secondary | ICD-10-CM

## 2023-01-08 IMAGING — MG MM DIGITAL SCREENING BILAT W/ TOMO AND CAD
8 series · 8 of 24 positions shown · non-contrast
Comparison: None.

CLINICAL DATA: Screening.

EXAM:
DIGITAL SCREENING BILATERAL MAMMOGRAM WITH TOMOSYNTHESIS AND CAD
TECHNIQUE: Bilateral screening digital craniocaudal and mediolateral oblique
mammograms were obtained. Bilateral screening digital breast
tomosynthesis was performed. The images were evaluated with
computer-aided detection.

[R CC synth-2D]
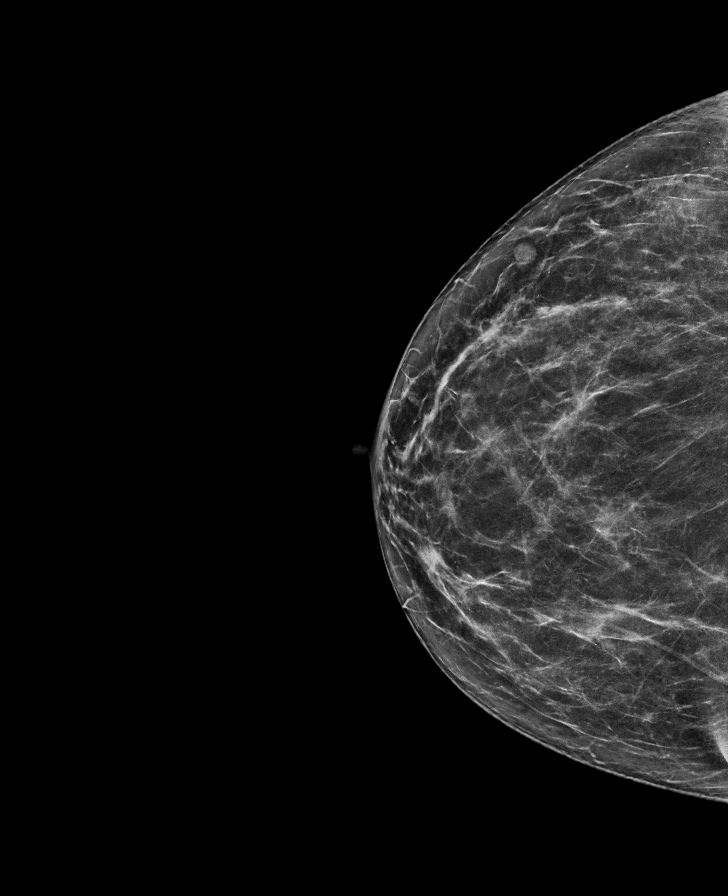

[L CC synth-2D]
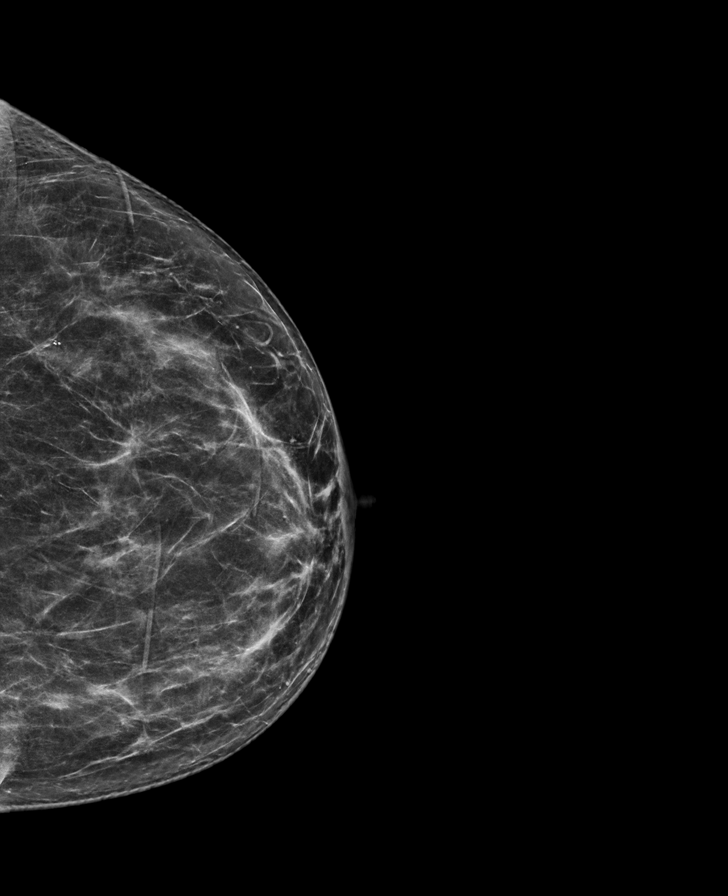

[L MLO synth-2D]
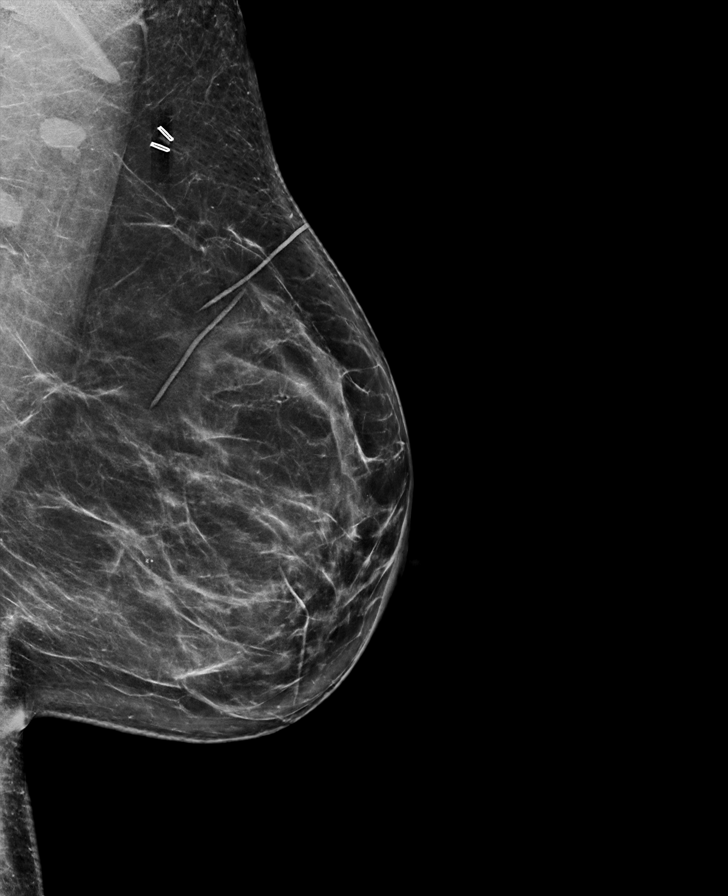

[R MLO synth-2D]
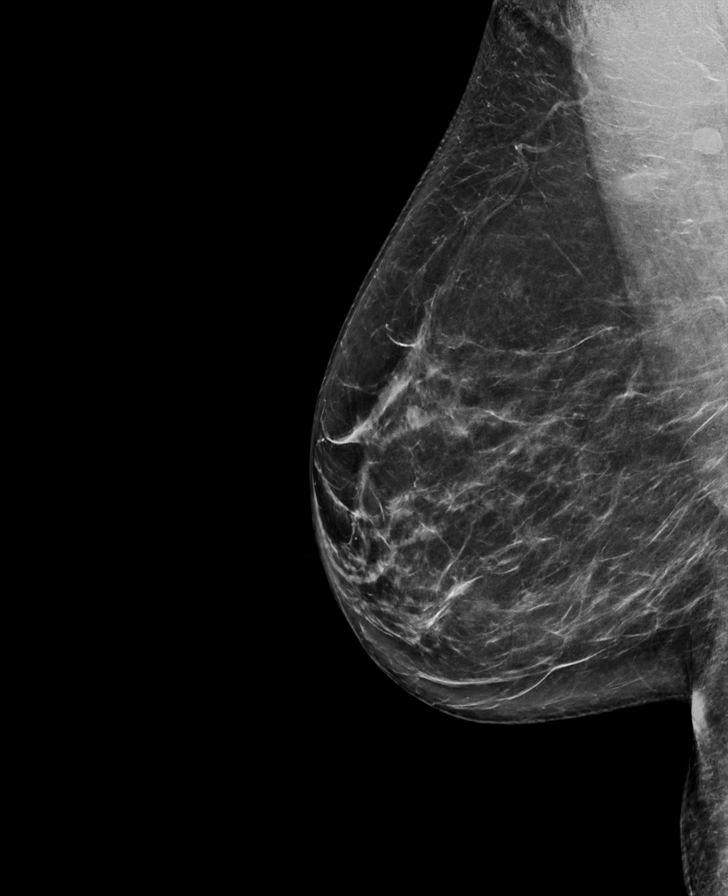

[R MLO tomo · tomo slice 43/86.0]
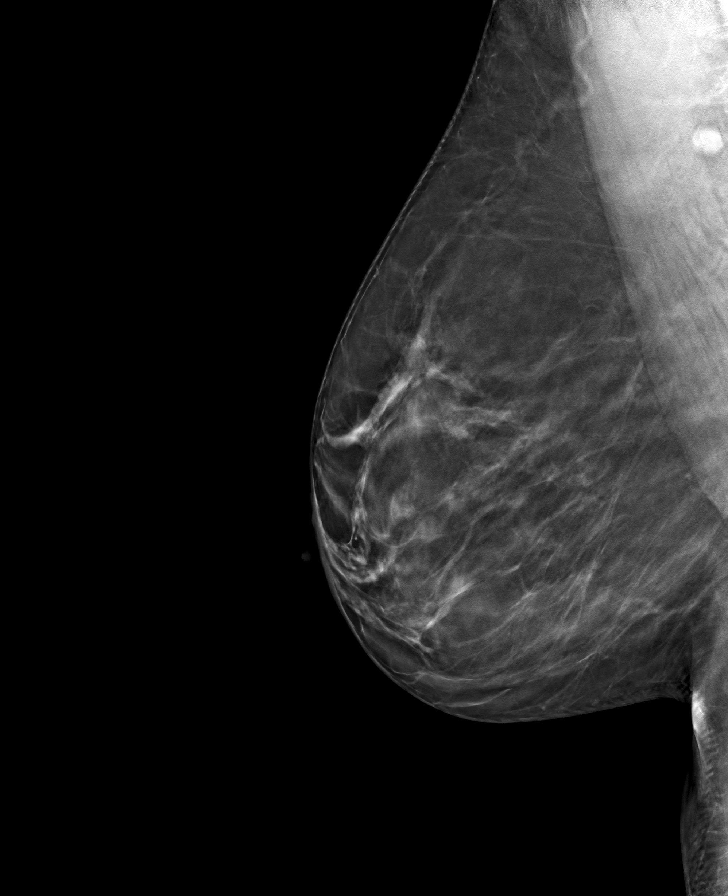

[L MLO tomo · tomo slice 43/86.0]
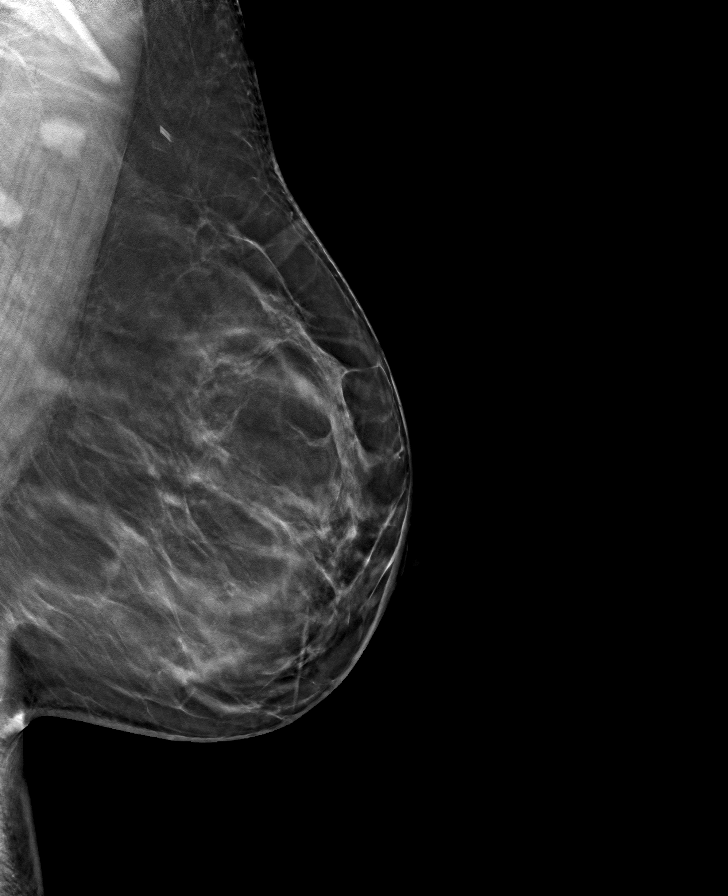

[R CC tomo · tomo slice 38/75.0]
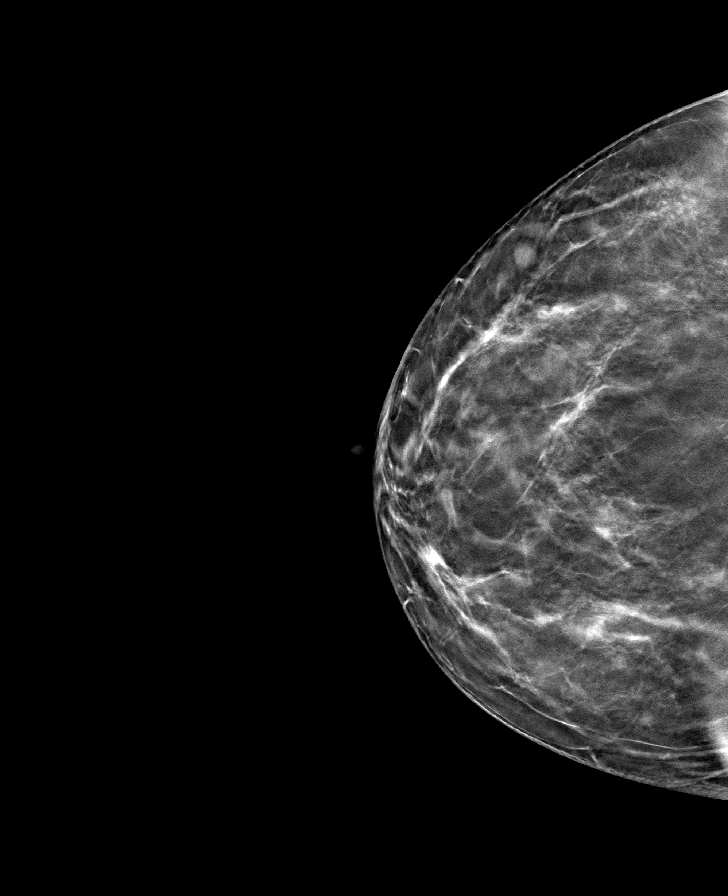

[L CC tomo · tomo slice 39/76.0]
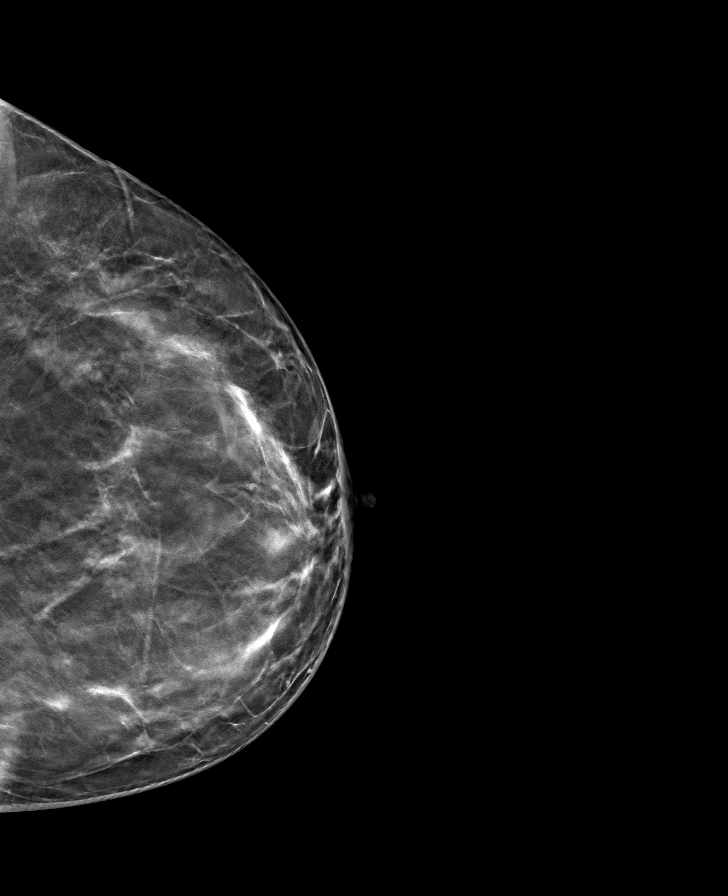

[8 of 24 positions shown; findings below may reference images not displayed]

ACR Breast Density Category c: The breast tissue is heterogeneously
dense, which may obscure small masses.
FINDINGS: In the right breast, a possible mass warrants further evaluation. In
the left breast, no findings suspicious for malignancy.
IMPRESSION: Further evaluation is suggested for a possible mass in the right
breast.

RECOMMENDATION:
Diagnostic mammogram and possibly ultrasound of the right breast.
(Code:[id number])

The patient will be contacted regarding the findings, and additional
imaging will be scheduled.

BI-RADS CATEGORY  0: Incomplete. Need additional imaging evaluation
and/or prior mammograms for comparison.
